# Patient Record
Sex: Female | Born: 1975 | Race: White | Hispanic: No | Marital: Married
Health system: Southern US, Community
[De-identification: ages and names within clinical notes are randomized; demographics above are authoritative.]

---

## 2003-01-16 ENCOUNTER — Ambulatory Visit (HOSPITAL_COMMUNITY): Admission: AD | Admit: 2003-01-16 | Discharge: 2003-01-16 | Payer: Self-pay | Admitting: Gynecology

## 2005-11-20 ENCOUNTER — Other Ambulatory Visit: Admission: RE | Admit: 2005-11-20 | Discharge: 2005-11-20 | Payer: Self-pay | Admitting: Obstetrics and Gynecology

## 2005-12-06 ENCOUNTER — Encounter (INDEPENDENT_AMBULATORY_CARE_PROVIDER_SITE_OTHER): Payer: Self-pay | Admitting: Specialist

## 2005-12-06 ENCOUNTER — Ambulatory Visit (HOSPITAL_COMMUNITY): Admission: RE | Admit: 2005-12-06 | Discharge: 2005-12-06 | Payer: Self-pay | Admitting: Obstetrics and Gynecology

## 2009-04-05 ENCOUNTER — Encounter: Admission: RE | Admit: 2009-04-05 | Discharge: 2009-04-05 | Payer: Self-pay | Admitting: Obstetrics and Gynecology

## 2009-08-02 ENCOUNTER — Encounter: Admission: RE | Admit: 2009-08-02 | Discharge: 2009-08-02 | Payer: Self-pay | Admitting: Obstetrics & Gynecology

## 2009-08-28 ENCOUNTER — Encounter: Admission: RE | Admit: 2009-08-28 | Discharge: 2009-08-28 | Payer: Self-pay | Admitting: Surgery

## 2009-08-28 ENCOUNTER — Ambulatory Visit (HOSPITAL_BASED_OUTPATIENT_CLINIC_OR_DEPARTMENT_OTHER): Admission: RE | Admit: 2009-08-28 | Discharge: 2009-08-28 | Payer: Self-pay | Admitting: Surgery

## 2010-04-06 LAB — POCT HEMOGLOBIN-HEMACUE: Hemoglobin: 13 g/dL (ref 12.0–15.0)

## 2010-06-08 NOTE — Op Note (Signed)
[name], Andrea Christensen                ACCOUNT NO.:  [id number]   MEDICAL RECORD NO.:  [id number]          PATIENT TYPE:  AMB   LOCATION:  SDC                           FACILITY:  [facility]   PHYSICIAN:  James A. Ashley Royalty, M.D.DATE OF BIRTH:  01/10/1976   DATE OF PROCEDURE:  DATE OF DISCHARGE:                               OPERATIVE REPORT   PREOPERATIVE DIAGNOSIS:  Inevitable abortion at approximately 8 weeks'  gestation.   POSTOPERATIVE DIAGNOSIS:  Inevitable abortion at approximately 8 weeks'  gestation, pathology pending   PROCEDURE:  Suction dilatation and curettage.   SURGEON:  Rudy Jew. Ashley Royalty, MD.   ANESTHESIA:  Monitored anesthesia care with 1% Xylocaine paracervical  block (20 mL).   FINDINGS:  Uterus sounded approximately 8 cm.  A moderate amount of  apparent products of conception was obtained.   ESTIMATED BLOOD LOSS:  Less than 50 mL.   COMPLICATIONS:  None.   PROCEDURE:  The patient was taken to the operating room, placed in the  dorsal supine position.  After IV sedatives were administered, she was  placed in the lithotomy position and prepped and draped in the usual  manner for vaginal surgery.  A posterior weighted retractor was placed  per vagina.  The anterior lip of the cervix was grasped with a single-  tooth tenaculum.  Approximately 20 mL of 1% Xylocaine were instilled  circumferentially to create a paracervical block.  The uterus was gently  sounded to approximately 8 cm.  The cervix was then serially dilated to  a size 25-French using Pratt dilators.  An 8 mm suction curette was  introduced into the uterine cavity and suction applied.  A moderate  amount of apparent products of conception was delivered through the  tubing.  After several passes with the suction curette, no additional  tissue was obtained.  The vaginal instruments were therefore then  removed, hemostasis noted, and the procedure terminated.   The patient was taken to the recovery room in  excellent condition.      James A. Ashley Royalty, M.D.  Electronically Signed     JAM/MEDQ  D:  12/06/2005  T:  12/06/2005  Job:  [id number]

## 2010-06-08 NOTE — Op Note (Signed)
Andrea Christensen, Andrea Christensen                            ACCOUNT NO.:  [id number]   MEDICAL RECORD NO.:  [id number]                   PATIENT TYPE:  AMB   LOCATION:  SDC                                  FACILITY:  [facility]   PHYSICIAN:  Ivor Costa. Farrel Gobble, M.D.              DATE OF BIRTH:  1975-02-19   DATE OF PROCEDURE:  01/16/2003  DATE OF DISCHARGE:                                 OPERATIVE REPORT   PREOPERATIVE DIAGNOSIS:  1. Torsed left ovary by Doppler.  2. A 7-week intrauterine pregnancy.   POSTOPERATIVE DIAGNOSES:  1. Torsed left ovary by Doppler.  2. A 7-week intrauterine pregnancy.  3. Pelvic adhesions.   PROCEDURE:  Diagnostic laparoscopy with lysis of adhesions.   SURGEON:  Ivor Costa. Farrel Gobble, M.D.   ANESTHESIA:  General.   ESTIMATED BLOOD LOSS:  None.   FINDINGS:  Normal liver, gallbladder and appendix.  There is a slightly  enlarged uterus, consistent with early pregnancy.  Right ovary had a corpus  luteum cyst.  The left ovary was not torsed and grossly normal in all  appearance, with the exception of the cyst when compared to the right.  There was a single filmy adhesion of the left fimbria to the bowel.   PATHOLOGY:  None.   COMPLICATIONS:  None.   DESCRIPTION OF PROCEDURE:  The patient was taken now to operating room;  general anesthesia was induced and then placed in the dorsal lithotomy  position.  Prepped and draped in the usual manner; however, the vagina was  not prepped because of the pregnancy.  A Foley catheter was placed.  An  infraumbilical incision was made with the scalpel, through which the Veress  needle was inserted.  Free flow of fluid was noted.  Opening pressure was 2.  Pneumoperitoneum was created until tympany was appreciated above the liver,  after which a #10-11 disposable trocar was inserted through the  infraumbilical port.  Because we did not have a manipulator in the uterus, a  #5 port was placed suprapubicly under direct visualization to help  sweep the  bowel out of the pelvis, and better visualize the adnexa.  The patient was  placed in Trendelenburg position; the left ovary was visualized and it was  not noticed to be torsed.  We carefully inspected the IP, the tubo-ovarian  ligament, tubes and ovary individually -- all were noted to be normal.   At this point, the ovary was sitting behind the uterus, as was the right.  The right ovary was also unremarkable, with the exception of the corpus  luteum cyst that had been documented earlier by ultrasound.  When the left  tube and ovary were elevated for visualization, we noted the thin filmy  adhesion from the left fimbria to the bowel fat of the sigmoid.  This was  lysed with scissors and noted to be hemostatic, as this may have prompted  the  spontaneous torsion followed by detorsion.   The instruments were then removed under direct visualization.  The  infraumbilical port fascia was closed with 0 Vicryl in a figure-of-eight.  The subcutaneous through to the skin was closed with 3-0 plain on the  infraumbilical port.  Both upper and lower ports were injected with a total  of 10 cc of 0.25% Marcaine solution.  Both ports were also closed with  Dermabond.   The patient was extubated in OR and transferred to the PACU in stable  condition.                                               Ivor Costa. Farrel Gobble, M.D.    THL/MEDQ  D:  01/16/2003  T:  01/16/2003  Job:  [id number]   cc:   Cleophus Molt  [address], [place]  [place] [address]  Fax: [phone]

## 2010-06-08 NOTE — H&P (Signed)
Andrea Christensen, Andrea Christensen                ACCOUNT NO.:  [id number]   MEDICAL RECORD NO.:  [id number]          PATIENT TYPE:  AMB   LOCATION:  SDC                           FACILITY:  [facility]   PHYSICIAN:  James A. Ashley Royalty, M.D.DATE OF BIRTH:  06-16-1975   DATE OF ADMISSION:  12/06/2005  DATE OF DISCHARGE:                              HISTORY & PHYSICAL   HISTORY OF PRESENT ILLNESS:  This is a 35 year old gravida 4, para 2-0-1-  2 at approximately 9 weeks, 5 days gestation by her menstrual period.  The patient presented November 28, 2005 complaining of slight vaginal  bleeding.  Ultrasound was performed which revealed a gestational sac  consistent with 5 weeks, 5 days gestation.  Differential diagnosis at  the time included spontaneous abortion versus intrauterine pregnancy,  which was too early to discern a fetus.  Hence, the patient was asked to  return in 1 week for followup.  She had an ultrasound December 05, 2005  which revealed no sonographic evidence of a viable pregnancy.  Nonetheless, she continues bleeding, and hence is for suction dilation  and curettage for inevitable abortion.   MEDICATIONS:  Vitamins.   PAST MEDICAL HISTORY:   MEDICAL:  Negative.   SURGICAL:  1. The patient had an ovary repaired during her second pregnancy      elsewhere.  Other details unavailable.  2. She also had a D&C for a spontaneous abortion in 2006.   ALLERGIES:  None.   FAMILY HISTORY:  Noncontributory.   SOCIAL HISTORY:  The patient denies use of tobacco or significant  alcohol.   REVIEW OF SYSTEMS:  Noncontributory.   PHYSICAL EXAMINATION:  GENERAL:  Well-developed, well-nourished,  pleasant white female in no acute distress.  VITAL SIGNS:  Afebrile.  Vital signs stable.  CHEST:  Lungs are clear.  CARDIAC:  Regular rate and rhythm.  ABDOMEN:  Soft and nontender.  PELVIC:  Please see most recent office exam.  External genitalia within  normal limits.  Vagina and cervix were without gross  lesions.  Bimanual  examination reveals the uterus to be approximately 8-9 weeks size, and  no adnexal masses palpable.   IMPRESSION:  Inevitable abortion.   PLAN:  Suction dilation and curettage.  Risks, benefits, complications  and alternatives were fully discussed with the patient.  She states she  understands and accepts.  Questions invited and answered.      James A. Ashley Royalty, M.D.  Electronically Signed     JAM/MEDQ  D:  12/06/2005  T:  12/06/2005  Job:  [id number]

## 2013-05-20 ENCOUNTER — Other Ambulatory Visit (HOSPITAL_COMMUNITY): Payer: Self-pay | Admitting: Obstetrics

## 2013-05-20 DIAGNOSIS — N971 Female infertility of tubal origin: Secondary | ICD-10-CM

## 2013-05-26 ENCOUNTER — Ambulatory Visit (HOSPITAL_COMMUNITY)
Admission: RE | Admit: 2013-05-26 | Discharge: 2013-05-26 | Disposition: A | Discharge status: Home / Self Care | Payer: No Typology Code available for payment source | Source: Ambulatory Visit | Attending: Obstetrics | Admitting: Obstetrics

## 2013-05-26 DIAGNOSIS — N971 Female infertility of tubal origin: Secondary | ICD-10-CM

## 2013-05-26 DIAGNOSIS — Z3049 Encounter for surveillance of other contraceptives: Secondary | ICD-10-CM | POA: Insufficient documentation

## 2013-05-26 MED ORDER — IOHEXOL 300 MG/ML  SOLN
20.0000 mL | Freq: Once | INTRAMUSCULAR | Status: AC | PRN
  Administered 2013-05-26: 20 mL

## 2015-02-09 IMAGING — RF DG HYSTEROGRAM
4 series · 4 of 4 positions shown · IV contrast (omnipaque)
Comparison: None.

FLUOROSCOPY TIME:  30 seconds

CLINICAL DATA: Post Essure.  Tubal occlusion.

EXAM:
HYSTEROSALPINGOGRAM
TECHNIQUE: Following cleansing of the cervix and vagina with Betadine solution,
a hysterosalpingogram was performed using a 5-French
hysterosalpingogram catheter and Omnipaque 300 contrast. The patient
tolerated the examination without difficulty.

[Series 1: run · 1 of 1 slices shown (1 of 4)]
[im 1/1]
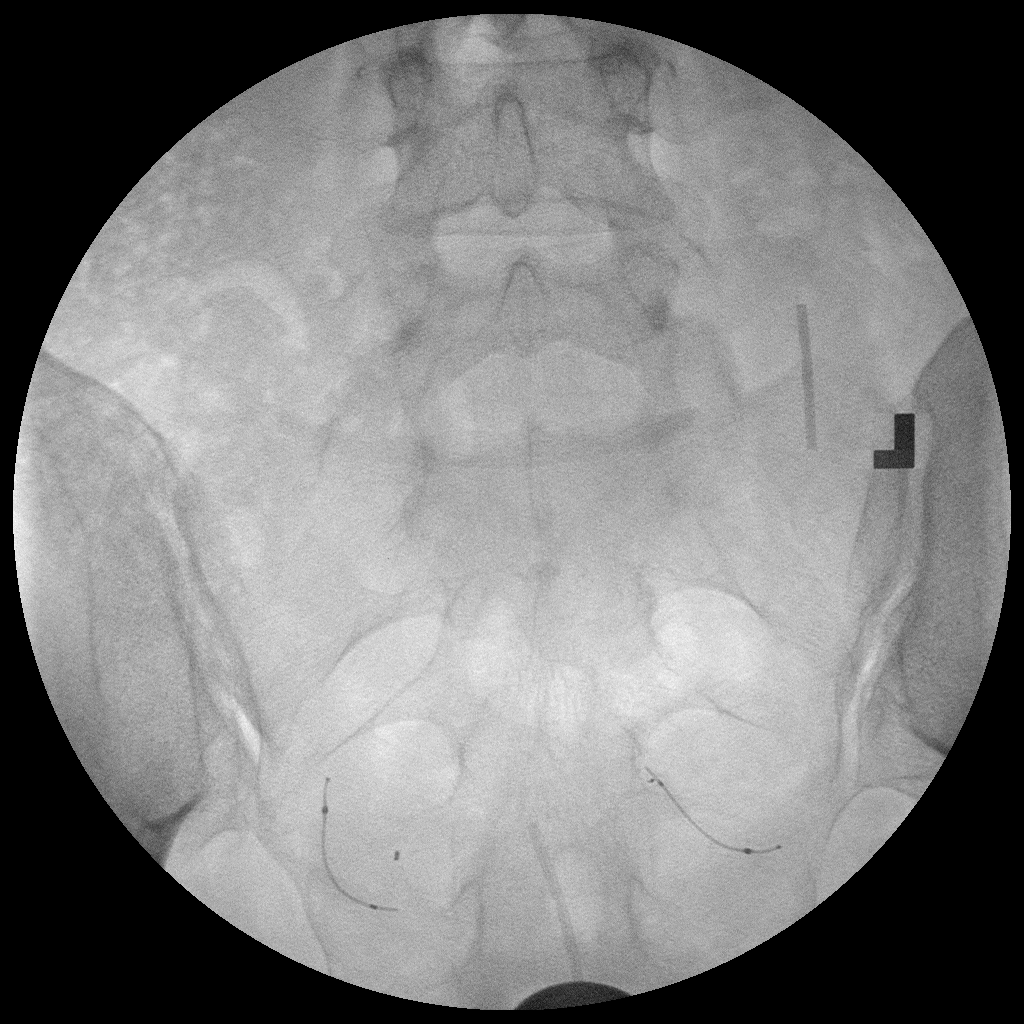

[Series 2: run · 1 of 1 slices shown (2 of 4)]
[im 1/1]
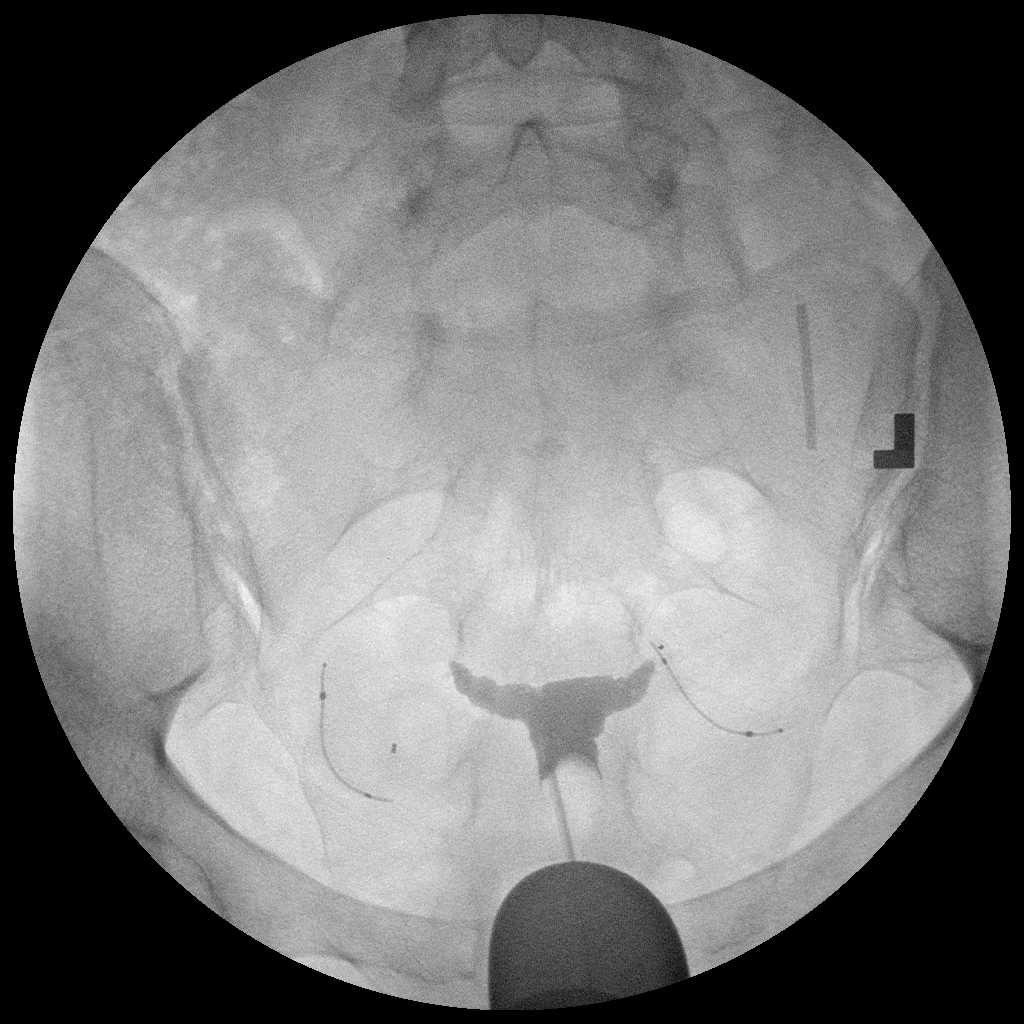

[Series 3: run · 1 of 1 slices shown (3 of 4)]
[im 1/1]
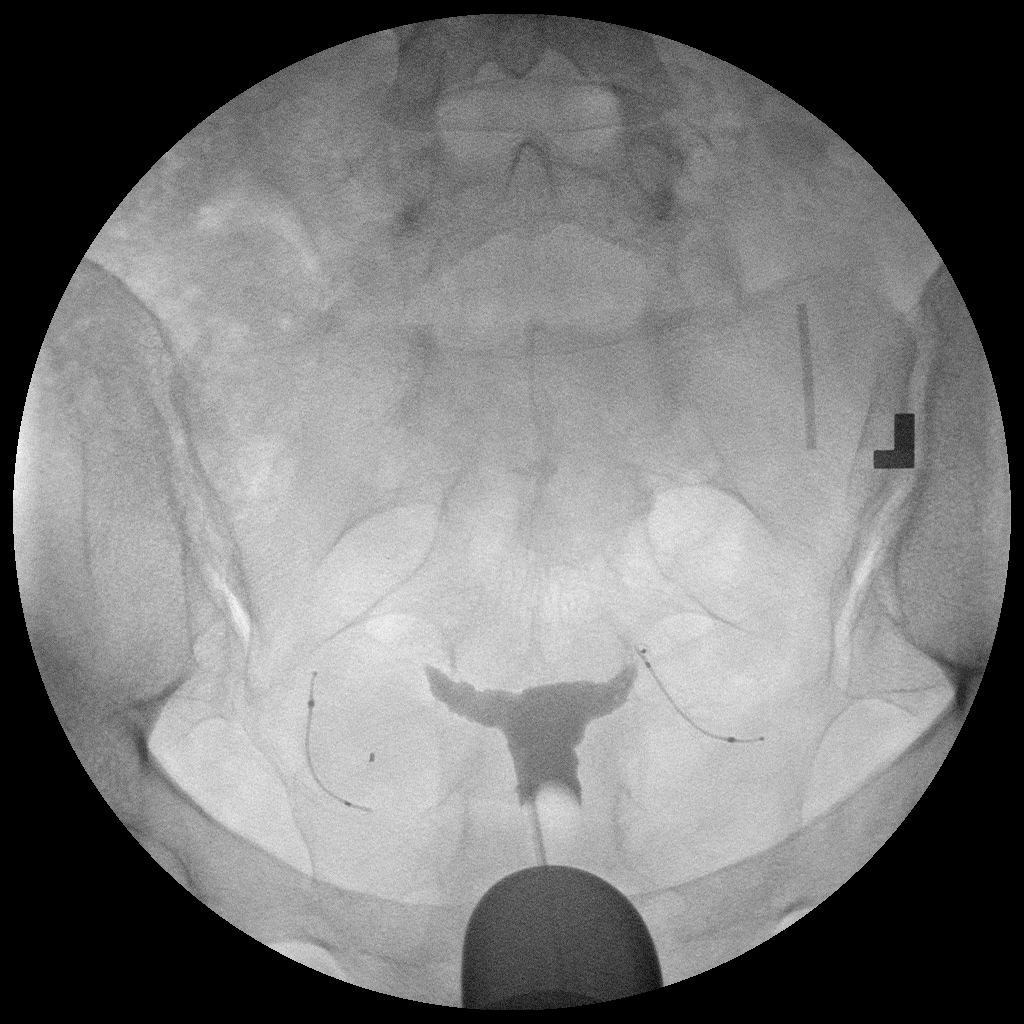

[Series 4: run · 1 of 1 slices shown (4 of 4)]
[im 1/1]
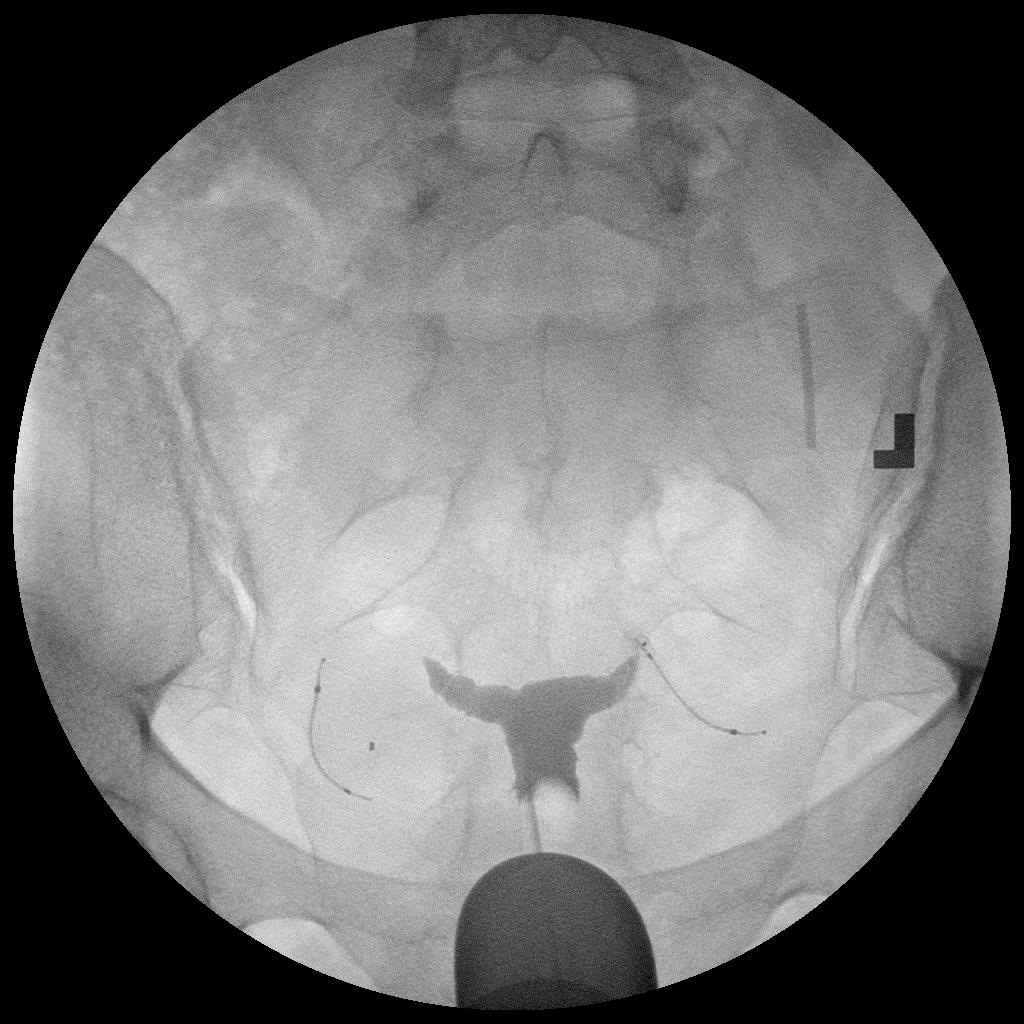

[4 of 4 positions shown; findings below may reference images not displayed]

FINDINGS: Filling of the uterine cavity demonstrates unremarkable appearance.
Bilateral fallopian tube coils are noted. The proximal end of the
left inner coil is near the cornua. The proximal end of the right
inner coil is located more distally within the fallopian tube but is
within 30 mm of the cornua. Both fallopian tubes are occluded.
IMPRESSION: Essure coils in expected position (right inner coil just within 30
mm of the cornua).

Bilateral tubal occlusion.

## 2015-03-01 ENCOUNTER — Ambulatory Visit (INDEPENDENT_AMBULATORY_CARE_PROVIDER_SITE_OTHER): Payer: PRIVATE HEALTH INSURANCE

## 2015-03-01 ENCOUNTER — Ambulatory Visit (INDEPENDENT_AMBULATORY_CARE_PROVIDER_SITE_OTHER): Payer: PRIVATE HEALTH INSURANCE | Admitting: Sports Medicine

## 2015-03-01 ENCOUNTER — Encounter: Payer: Self-pay | Admitting: Sports Medicine

## 2015-03-01 DIAGNOSIS — M79671 Pain in right foot: Secondary | ICD-10-CM

## 2015-03-01 DIAGNOSIS — M2040 Other hammer toe(s) (acquired), unspecified foot: Secondary | ICD-10-CM

## 2015-03-01 DIAGNOSIS — M722 Plantar fascial fibromatosis: Secondary | ICD-10-CM

## 2015-03-01 DIAGNOSIS — M21619 Bunion of unspecified foot: Secondary | ICD-10-CM | POA: Diagnosis not present

## 2015-03-01 MED ORDER — TRIAMCINOLONE ACETONIDE 10 MG/ML IJ SUSP: 10.0000 mg | Freq: Once | INTRAMUSCULAR | Status: AC

## 2015-03-01 NOTE — Patient Instructions (Signed)

## 2015-03-01 NOTE — Progress Notes (Signed)
Patient ID: Andrea Christensen, female   DOB: 08-Nov-1975, 40 y.o.   MRN: [id number] Subjective: Andrea Christensen is a 40 y.o. female patient presents to office with complaint of heel pain on the right. 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 Patient has treated this problem with Stretching, icing occasionally soaking. Patient reports that she went to urgent care on Thursday because the pain was so excruciating to her heel. She felt like she could no longer put coming to the doctor of; states that she was started on Voltaren which she had allergic reaction to with swelling and blue coloration to lips thus, the medication was changed to meloxicam which she started on Monday and has been tolerating okay. However states that she does not feel as much improvement so far with pain meloxicam as she did with the daily use of the Voltaren. Patient denies any known causative factor that could have started this. 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  Denies any other pedal complaints at this time  There are no active problems to display for this patient.  No current outpatient prescriptions on file prior to visit.   No current facility-administered medications on file prior to visit.   Allergies  Allergen Reactions  . Voltaren [Diclofenac Sodium] Swelling    Face swells    Objective: Physical Exam General: The patient is alert and oriented x3 in no acute distress.  Dermatology: Skin is warm, dry and supple bilateral lower extremities. Nails 1-10 are normal. There is no erythema, edema, no eccymosis, no open lesions present. Integument is otherwise unremarkable.  Vascular: Dorsalis Pedis pulse and Posterior Tibial pulse are 2/4 bilateral. Capillary fill time is immediate to all digits.  Neurological: Grossly intact to light touch with an achilles reflex of +2/5 and a  negative Tinel's sign  bilateral.  Musculoskeletal: Tenderness to palpation at the medial calcaneal tubercale and through the insertion of the plantar fascia on the right foot. No pain with compression of calcaneus bilateral. No pain with tuning fork to calcaneus bilateral. No pain with calf compression bilateral. Ankle and pedal joints range of motion within normal limits bilateral. Strength 5/5 in all groups bilateral.  +Asymptomaic bunion and hammertoes noted bilateral.   Gait: Unassisted, Mildly Antalgic avoid weight on right heel  Xray, Right foot: Normal osseous mineralization. Joint spaces preserved. No fracture/dislocation/boney destruction. Calcaneal spur present with mild thickening of plantar fascia. Bunion and hammer toe deformity. No other osseous or soft tissue abnormalities or radiopaque foreign bodies noted.   Assessment and Plan: Problem List Items Addressed This Visit    None    Visit Diagnoses    Right foot pain    -  Primary    Relevant Medications    triamcinolone acetonide (KENALOG) 10 MG/ML injection 10 mg    Other Relevant Orders    DG Foot Complete Right    Plantar fasciitis of right foot        Relevant Medications    triamcinolone acetonide (KENALOG) 10 MG/ML injection 10 mg    Bunion        Asymptomatic    Hammer toe, unspecified laterality        Asymptomatic       -Complete examination performed. Discussed with patient in detail the condition of plantar fasciitis, how this occurs and general treatment options. Explained both conservative and surgical treatments.  -  After oral consent and aseptic prep, injected a mixture containing 1 ml of 2%  plain lidocaine, 1 ml 0.5% plain marcaine, 0.5 ml of kenalog 10 and 0.5 ml of dexamethasone phosphate into right heel. Post-injection care discussed with patient.  -Cont Meloxicam as rx -Recommended good supportive shoes and advised use of OTC insert. Explained to patient that if these orthoses work well, we will continue with these. If  these do not improve her condition and  pain, we will consider custom molded orthoses. . Advised patient to refrain from walking barefoot and to refrain from wearing non-supportive flip-flops like she presented with in the office on today -Explained in detail the use of the fascial brace which was dispensed at today's visit. -Explained and dispensed to patient daily stretching exercises. -Recommend patient to ice affected area 1-2x daily.  If patient decides to soak with Epsom salt advised to use ice after warm soaks to help with inflammation in the area. -Patient to return to office in 3 weeks for follow up or sooner if problems or questions arise.  Asencion Islam, DPM

## 2015-03-01 NOTE — Progress Notes (Deleted)
   Subjective:    Patient ID: Andrea Christensen, female    DOB: 04-Jul-1975, 40 y.o.   MRN: [id number]  HPI    Review of Systems  All other systems reviewed and are negative.      Objective:   Physical Exam        Assessment & Plan:

## 2015-03-23 ENCOUNTER — Ambulatory Visit: Payer: PRIVATE HEALTH INSURANCE | Admitting: Sports Medicine

## 2015-04-06 ENCOUNTER — Ambulatory Visit: Payer: PRIVATE HEALTH INSURANCE | Admitting: Sports Medicine

## 2023-01-23 DIAGNOSIS — K432 Incisional hernia without obstruction or gangrene: Secondary | ICD-10-CM | POA: Insufficient documentation

## 2023-09-08 DIAGNOSIS — M25561 Pain in right knee: Secondary | ICD-10-CM | POA: Insufficient documentation

## 2023-11-07 DIAGNOSIS — M9420 Chondromalacia, unspecified site: Secondary | ICD-10-CM | POA: Insufficient documentation

## 2024-01-23 ENCOUNTER — Encounter (HOSPITAL_BASED_OUTPATIENT_CLINIC_OR_DEPARTMENT_OTHER): Payer: Self-pay

## 2024-01-23 ENCOUNTER — Ambulatory Visit (HOSPITAL_BASED_OUTPATIENT_CLINIC_OR_DEPARTMENT_OTHER): Admit: 2024-01-23 | Discharge: 2024-01-23 | Disposition: A | Discharge status: Home / Self Care | Admitting: Radiology

## 2024-01-23 ENCOUNTER — Ambulatory Visit (HOSPITAL_BASED_OUTPATIENT_CLINIC_OR_DEPARTMENT_OTHER)
Admission: EM | Admit: 2024-01-23 | Discharge: 2024-01-23 | Disposition: A | Discharge status: Home / Self Care | Attending: Family Medicine | Admitting: Family Medicine

## 2024-01-23 ENCOUNTER — Inpatient Hospital Stay (HOSPITAL_BASED_OUTPATIENT_CLINIC_OR_DEPARTMENT_OTHER): Admit: 2024-01-23 | Admitting: Radiology

## 2024-01-23 DIAGNOSIS — M79674 Pain in right toe(s): Secondary | ICD-10-CM | POA: Diagnosis not present

## 2024-01-23 DIAGNOSIS — M25474 Effusion, right foot: Secondary | ICD-10-CM

## 2024-01-23 DIAGNOSIS — M79671 Pain in right foot: Secondary | ICD-10-CM

## 2024-01-23 DIAGNOSIS — S99921A Unspecified injury of right foot, initial encounter: Secondary | ICD-10-CM

## 2024-01-23 NOTE — ED Provider Notes (Signed)
 " [facility]    CSN: [id number] Arrival date & time: 01/23/24  1524      History   Chief Complaint Chief Complaint  Patient presents with   Toe Pain    HPI Andrea Christensen is a 49 y.o. female.   Patient is a 49 year old female presents today with right foot pain.  States has a bunion to right great toe. While taking a step, felt a pop to base of right great toe. States very painful to ambulate, can't handle any pressure to that area. +swelling and redness. Took 1000 mg Tylenol at 1330.    Toe Pain    History reviewed. No pertinent past medical history.  There are no active problems to display for this patient.   History reviewed. No pertinent surgical history.  OB History   No obstetric history on file.      Home Medications    Prior to Admission medications  Medication Sig Start Date End Date Taking? Authorizing Provider  meloxicam (MOBIC) 15 MG tablet Take 15 mg by mouth daily.    [provider]    Family History History reviewed. No pertinent family history.  Social History Social History[1]   Allergies   Voltaren [diclofenac sodium]   Review of Systems Review of Systems See HPI  Physical Exam Triage Vital Signs ED Triage Vitals  Encounter Vitals Group     BP 01/23/24 1733 136/89     Girls Systolic BP Percentile --      Girls Diastolic BP Percentile --      Boys Systolic BP Percentile --      Boys Diastolic BP Percentile --      Pulse Rate 01/23/24 1733 70     Resp 01/23/24 1733 20     Temp 01/23/24 1733 98.9 F (37.2 C)     Temp Source 01/23/24 1733 Oral     SpO2 01/23/24 1733 98 %     Weight --      Height --      Head Circumference --      Peak Flow --      Pain Score 01/23/24 1734 8     Pain Loc --      Pain Education --      Exclude from Growth Chart --    No data found.  Updated Vital Signs BP 136/89 (BP Location: Right Arm)   Pulse 70   Temp 98.9 F (37.2 C) (Oral)   Resp 20   SpO2 98%   Visual  Acuity Right Eye Distance:   Left Eye Distance:   Bilateral Distance:    Right Eye Near:   Left Eye Near:    Bilateral Near:     Physical Exam Vitals and nursing note reviewed.  Constitutional:      General: She is not in acute distress.    Appearance: Normal appearance. She is not ill-appearing, toxic-appearing or diaphoretic.  Pulmonary:     Effort: Pulmonary effort is normal.  Musculoskeletal:        General: Swelling and tenderness present.     Comments: Bunion noted to right foot base fifth first metatarsal with increased swelling and erythema.  Very painful.  Neurological:     Mental Status: She is alert.  Psychiatric:        Mood and Affect: Mood normal.      UC Treatments / Results  Labs (all labs ordered are listed, but only abnormal results are displayed) Labs Reviewed - No  data to display  EKG   Radiology No results found.  Procedures Procedures (including critical care time)  Medications Ordered in UC Medications - No data to display  Initial Impression / Assessment and Plan / UC Course  I have reviewed the triage vital signs and the nursing notes.  Pertinent labs & imaging results that were available during my care of the patient were reviewed by me and considered in my medical decision making (see chart for details).     Right foot pain-patient with known bunion to foot but typically does not painful.  Heard a pop earlier and started to become very painful and increased swelling and redness. X-ray done here today and revealed No acute osseous abnormality. Soft tissue swelling adjacent to the head of the first metatarsal 2. Bunion at the head of first metatarsal with mild degenerative change at first MTP joint.  ? Tendon injury to the area.  Place patient in cam walker boot to wear to help take pressure off the area.  Recommend rest, ice, elevate the foot is much as possible.  Recommend naproxen for pain and inflammation.  She can also take Exer  strength Tylenol in addition.  Recommend follow-up with podiatry for any continued problems Final Clinical Impressions(s) / UC Diagnoses   Final diagnoses:  Injury of right foot, initial encounter  Right foot pain     Discharge Instructions      We have placed you in a cam walker boot to wear to help take pressure off the area.  You can rest, ice, elevate the foot is much as possible.  You can take naproxen for pain and inflammation.  You can take up to 500 mg per dose.  You can also take Exer strength Tylenol in addition.  Recommend follow-up with podiatry for continued issues   ED Prescriptions   None    PDMP not reviewed this encounter.     [1]  Social History Tobacco Use   Smoking status: Unknown   Smokeless tobacco: Never     Adah Wilbert LABOR, FNP 01/24/24 1219  "

## 2024-01-23 NOTE — ED Triage Notes (Signed)
 States has a bunion to right great toe. While taking a step, felt a pop to base of right great toe. States very painful to ambulate, can't handle any pressure to that area. +swelling and redness. Took 100mg  Tylenol at 1330.

## 2024-01-23 NOTE — Discharge Instructions (Signed)
 We have placed you in a cam walker boot to wear to help take pressure off the area.  You can rest, ice, elevate the foot is much as possible.  You can take naproxen for pain and inflammation.  You can take up to 500 mg per dose.  You can also take Exer strength Tylenol in addition.  Recommend follow-up with podiatry for continued issues

## 2024-01-26 ENCOUNTER — Encounter: Payer: Self-pay | Admitting: Podiatry

## 2024-01-26 ENCOUNTER — Ambulatory Visit (INDEPENDENT_AMBULATORY_CARE_PROVIDER_SITE_OTHER): Admitting: Podiatry

## 2024-01-26 ENCOUNTER — Ambulatory Visit (HOSPITAL_COMMUNITY): Payer: Self-pay

## 2024-01-26 DIAGNOSIS — M21611 Bunion of right foot: Secondary | ICD-10-CM | POA: Diagnosis not present

## 2024-01-26 DIAGNOSIS — R262 Difficulty in walking, not elsewhere classified: Secondary | ICD-10-CM | POA: Diagnosis not present

## 2024-01-26 DIAGNOSIS — S99921A Unspecified injury of right foot, initial encounter: Secondary | ICD-10-CM | POA: Diagnosis not present

## 2024-01-26 DIAGNOSIS — M7751 Other enthesopathy of right foot: Secondary | ICD-10-CM | POA: Diagnosis not present

## 2024-01-26 MED ORDER — TRAMADOL HCL 50 MG PO TABS: 50.0000 mg | ORAL_TABLET | Freq: Two times a day (BID) | ORAL | 0 refills | Status: AC

## 2024-01-26 NOTE — Progress Notes (Signed)
 "   Chief Complaint  Patient presents with   Foot Injury    Right foot injury on Friday 01/23/24. She was walking from one room to another, took a step and herd a pop on the bunion area. Painful to touch, and bear weight. UC put her in a boot, and she is ok in the boot, but being out of it hurts. UC also took xrays they are under imaging in chart review. RICE has been used.  Not diabetic and no anti coag.    Discussed the use of AI scribe software for clinical note transcription with the patient, who gave verbal consent to proceed.  History of Present Illness Andrea Christensen is a 49 year old female with right foot bunion who presents with acute right foot pain following injury.  She experienced sudden onset of severe right foot pain while ambulating from 1 room to another while wearing shoes, occurring on 01-23-24, describing a distinct popping sensation at the medial eminence of the right foot in the region of her bunion. The pain was immediate, excruciating, and localized, accompanied by discoloration. She was wearing shoes at the time of injury. The pain is aggravated by direct pressure to the bunion and is severe enough to interfere with sleep; she has been unable to sleep over the past several nights due to discomfort.  She initially avoided the emergency department due to concerns about illness/flu exposure and instead sought evaluation at an urgent care facility with imaging capabilities. She was placed in a pneumatic walker boot, which provides significant relief compared to ambulation without protection. She has been using ice, elevation, and rest at home. She was advised to take naproxen, but reports inadequate pain control. She has an allergy to diclofenac, which causes facial swelling, but tolerates other NSAIDs. She has not used ibuprofen for this episode.  She has longstanding right foot bunion deformity, more pronounced on the right. She is aware of the chronic nature of her bunion and  expresses concern regarding the potential need for surgical intervention, referencing a family member's prolonged recovery after bunion surgery.  History reviewed. No pertinent past medical history. History reviewed. No pertinent surgical history. Allergies[1]  Physical Exam EXTREMITIES: Tenderness on palpation of the right foot at the first MPJ, with severe tenderness on the plantar, medial and dorsal aspect.  Pain on palpation of the joint.  EHL and FHL tendon intact on palpation.  Pain with resisted dorsiflexion and with resisted plantarflexion of the hallux at the first MPJ level.  Palpable pedal pulses noted.  There is a slightly ecchymotic discoloration at the dorsal medial aspect of the right bunion.  No significant or spreading ecchymosis noted.  No calor noted.  Hallux valgus noted.  Antalgic gait with weightbearing without her cam walker.  Epicritic sensation intact.  No open lesions no    Results Radiology Right foot X-ray (urgent care foot radiograph views from 01/23/2024 were reviewed in exam room today): No fracture, no avulsion, joint space maintained, sesamoid bones in anatomic position, increased hallux valgus and first intermetatarsal angle, no radiographic evidence of stress fracture, no osseous pathology (Independently interpreted)    Assessment/Plan of Care: 1. Injury of right foot, initial encounter   2. Capsulitis of metatarsophalangeal (MTP) joint of right foot   3. Bunion, right foot   4. Difficulty walking      Meds ordered this encounter  Medications   traMADol  (ULTRAM ) 50 MG tablet    Sig: Take 1 tablet (50 mg total) by  mouth 2 (two) times daily for 13 days.    Dispense:  25 tablet    Refill:  0   Assessment & Plan Right foot bunion with acute capsular injury She has an acute capsular injury of the right foot bunion, presenting with sudden severe pain and localized tenderness after a popping event. Imaging showed no fracture, avulsion, or bony injury. 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 Tendons are intact. Conservative management is expected to result in improvement. Surgical correction was discussed as a future option, not indicated acutely. Anticipated surgical recovery would require four to six weeks in a surgical shoe with temporary limitations on driving and work. Risks of surgery include postoperative pain and downtime, with severe pain uncommon unless extensive intervention is required. - Continued immobilization in a walking boot to stabilize the injury. - Advised rest, ice, elevation, and minimizing weight-bearing as tolerated. - Prescribed tramadol  for pain management and sleep, given inadequate response to naproxen and allergy to diclofenac. - Instructed her to wear the boot the majority of the time, especially when ambulating, and to consider sleeping in the boot if nocturnal pain is significant. - Scheduled follow-up in two weeks to reassess symptoms and healing. - Planned repeat radiographs at follow-up if pain persists or worsens to rule out stress fracture. - Discussed possible MRI if radiographs remain negative but symptoms persist, noting insurance requirements for conservative management first. - Provided anticipatory guidance regarding signs of worsening symptoms and instructed her to call if condition deteriorates or changes in appearance occur. - Reviewed surgical options for bunion correction as a future consideration, including expected recovery time and potential impact on work and daily activities.  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 - Avoided diclofenac and related medications. - Selected tramadol  for pain management due to allergy with diclofenac and inadequate response to naproxen.  Follow-up in 2 to 3 weeks for recheck.    Awanda CHARM Imperial, DPM, FACFAS [facility]     [address] [place], [place] [address]                Office [phone]  Fax [phone]      [1]  Allergies Allergen Reactions   Diclofenac Sodium Swelling    Face swells    Face swells  Face swells  Face swells   Face swells   Face swells   diclofenac sodium   Voltaren [Diclofenac Sodium] Swelling    Face swells    "

## 2024-02-13 ENCOUNTER — Encounter: Admitting: Podiatry

## 2024-02-13 NOTE — Progress Notes (Signed)
Patient did not show for scheduled appointment today.

## 2024-02-16 ENCOUNTER — Ambulatory Visit: Admitting: Podiatry
